# Patient Record
Sex: Male | Born: 2000 | Race: White | Hispanic: No | Marital: Single | Smoking: Never smoker
Health system: Southern US, Community
[De-identification: ages and names within clinical notes are randomized; demographics above are authoritative.]

## PROBLEM LIST (undated history)

## (undated) DIAGNOSIS — F909 Attention-deficit hyperactivity disorder, unspecified type: Secondary | ICD-10-CM

## (undated) HISTORY — DX: Attention-deficit hyperactivity disorder, unspecified type: F90.9

---
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
## 2020-03-24 ENCOUNTER — Ambulatory Visit: Payer: No Typology Code available for payment source | Admitting: Family Medicine

## 2020-03-28 ENCOUNTER — Encounter: Payer: Self-pay | Admitting: Family Medicine

## 2020-07-14 ENCOUNTER — Other Ambulatory Visit: Payer: No Typology Code available for payment source
